# Patient Record
Sex: Female | Born: 1980 | Race: White | Hispanic: No | Marital: Single | Smoking: Current every day smoker
Health system: Southern US, Community
[De-identification: ages and names within clinical notes are randomized; demographics above are authoritative.]

---

## 2017-09-02 ENCOUNTER — Encounter: Payer: Self-pay | Admitting: Emergency Medicine

## 2017-09-02 ENCOUNTER — Other Ambulatory Visit: Payer: Self-pay

## 2017-09-02 ENCOUNTER — Emergency Department
Admission: EM | Admit: 2017-09-02 | Discharge: 2017-09-02 | Disposition: A | Discharge status: Home / Self Care | Payer: BLUE CROSS/BLUE SHIELD | Source: Home / Self Care | Attending: Family Medicine | Admitting: Family Medicine

## 2017-09-02 DIAGNOSIS — R21 Rash and other nonspecific skin eruption: Secondary | ICD-10-CM | POA: Diagnosis not present

## 2017-09-02 MED ORDER — DOXYCYCLINE HYCLATE 100 MG PO CAPS: 100.0000 mg | ORAL_CAPSULE | Freq: Two times a day (BID) | ORAL | 0 refills | Status: AC

## 2017-09-02 MED ORDER — PREDNISONE 20 MG PO TABS: ORAL_TABLET | ORAL | 0 refills | Status: AC

## 2017-09-02 NOTE — ED Triage Notes (Signed)
Rash under Right eye x 2 days, went to a river this weekend, denies itch, hurt or burn

## 2017-09-02 NOTE — ED Provider Notes (Signed)
[facility]    CSN: [id number] Arrival date & time: 09/02/17  1737     History   Chief Complaint Chief Complaint  Patient presents with  . Rash    HPI Kathryn Fischer is a 37 y.o. female.   Patient was swimming in a river 4 days ago.  Two days ago she awoke with a rash beneath her right eye that has persisted.  She feels well otherwise.  The history is provided by the patient.  Rash  Location:  Face Facial rash location:  R cheek Quality: blistering, dryness and redness   Quality: not bruising, not burning, not draining, not itchy, not painful, not peeling, not scaling, not swelling and not weeping   Severity:  Mild Onset quality:  Sudden Duration:  2 days Timing:  Constant Progression:  Unchanged Chronicity:  New Context: not animal contact, not chemical exposure, not exposure to similar rash, not food, not hot tub use, not insect bite/sting, not medications, not new detergent/soap, not nuts, not plant contact and not sick contacts   Context comment:  Swimming in river Relieved by:  Nothing Worsened by:  Nothing Ineffective treatments:  None tried Associated symptoms: no abdominal pain, no diarrhea, no fatigue, no fever, no headaches, no induration, no joint pain, no myalgias, no nausea, no periorbital edema, no sore throat, no throat swelling, no tongue swelling and no URI     History reviewed. No pertinent past medical history.  There are no active problems to display for this patient.   History reviewed. No pertinent surgical history.  OB History   None      Home Medications    Prior to Admission medications   Medication Sig Start Date End Date Taking? Authorizing Provider  cetirizine (ZYRTEC) 10 MG tablet Take 10 mg by mouth daily.   Yes [provider]  phentermine 37.5 MG capsule Take 37.5 mg by mouth every morning.   Yes [provider]  doxycycline (VIBRAMYCIN) 100 MG capsule Take 1 capsule (100 mg total) by mouth 2  (two) times daily. Take with food. 09/02/17   Lattie HawBeese, Stephen A, MD  predniSONE (DELTASONE) 20 MG tablet Take one tab by mouth twice daily for 3 days, then one daily. Take with food. 09/02/17   Lattie HawBeese, Stephen A, MD    Family History History reviewed. No pertinent family history.  Social History Social History   Tobacco Use  . Smoking status: Current Every Day Smoker  . Smokeless tobacco: Never Used  Substance Use Topics  . Alcohol use: Yes  . Drug use: Not Currently     Allergies   Patient has no known allergies.   Review of Systems Review of Systems  Constitutional: Negative for fatigue and fever.  HENT: Negative for sore throat.   Gastrointestinal: Negative for abdominal pain, diarrhea and nausea.  Musculoskeletal: Negative for arthralgias and myalgias.  Skin: Positive for rash.  Neurological: Negative for headaches.  All other systems reviewed and are negative.    Physical Exam Triage Vital Signs ED Triage Vitals  Enc Vitals Group     BP 09/02/17 1807 116/77     Pulse Rate 09/02/17 1807 78     Resp --      Temp 09/02/17 1807 98.2 F (36.8 C)     Temp Source 09/02/17 1807 Oral     SpO2 09/02/17 1807 100 %     Weight 09/02/17 1808 221 lb (100.2 kg)     Height 09/02/17 1808 5\' 7"  (1.702 m)  Head Circumference --      Peak Flow --      Pain Score 09/02/17 1807 0     Pain Loc --      Pain Edu? --      Excl. in GC? --    No data found.  Updated Vital Signs BP 116/77 (BP Location: Right Arm)   Pulse 78   Temp 98.2 F (36.8 C) (Oral)   Ht 5\' 7"  (1.702 m)   Wt 100.2 kg   LMP 08/13/2017 (Exact Date)   SpO2 100%   BMI 34.61 kg/m   Visual Acuity Right Eye Distance:   Left Eye Distance:   Bilateral Distance:    Right Eye Near:   Left Eye Near:    Bilateral Near:     Physical Exam  Constitutional: She appears well-developed and well-nourished. No distress.  HENT:  Head: Normocephalic.  Right Ear: Tympanic membrane, external ear and ear canal  normal.  Left Ear: Tympanic membrane, external ear and ear canal normal.  Nose: Nose normal.  Mouth/Throat: Oropharynx is clear and moist.  Eyes: Pupils are equal, round, and reactive to light.    Beneath the right eye is a patch of macular erythema with tiny central pustules present.  No swelling, warmth, tenderness, or induration.  Neck: Neck supple.  Cardiovascular: Normal rate.  Pulmonary/Chest: Effort normal.  Musculoskeletal: She exhibits no edema.  Lymphadenopathy:    She has no cervical adenopathy.  Neurological: She is alert.  Skin: Skin is warm and dry.  Nursing note and vitals reviewed.    UC Treatments / Results  Labs (all labs ordered are listed, but only abnormal results are displayed) Labs Reviewed - No data to display  EKG None  Radiology No results found.  Procedures Procedures (including critical care time)  Medications Ordered in UC Medications - No data to display  Initial Impression / Assessment and Plan / UC Course  I have reviewed the triage vital signs and the nursing notes.  Pertinent labs & imaging results that were available during my care of the patient were reviewed by me and considered in my medical decision making (see chart for details).    ?Contact dermatitis vs ?folliculitis. Begin empiric doxycycline for staph coverage, and prednisone burst/taper. Followup with Family Doctor if not improved in one week.  Final Clinical Impressions(s) / UC Diagnoses   Final diagnoses:  Rash and nonspecific skin eruption     Discharge Instructions     Return for worsening symptoms.    ED Prescriptions    Medication Sig Dispense Auth. Provider   predniSONE (DELTASONE) 20 MG tablet Take one tab by mouth twice daily for 3 days, then one daily. Take with food. 9 tablet Lattie Haw, MD   doxycycline (VIBRAMYCIN) 100 MG capsule Take 1 capsule (100 mg total) by mouth 2 (two) times daily. Take with food. 14 capsule Lattie Haw, MD         Lattie Haw, MD 09/04/17 [phone]

## 2017-09-02 NOTE — Discharge Instructions (Signed)
Return for worsening symptoms.    

## 2017-09-21 ENCOUNTER — Ambulatory Visit (HOSPITAL_BASED_OUTPATIENT_CLINIC_OR_DEPARTMENT_OTHER)
Admission: RE | Admit: 2017-09-21 | Discharge: 2017-09-21 | Disposition: A | Discharge status: Home / Self Care | Payer: BLUE CROSS/BLUE SHIELD | Source: Ambulatory Visit | Attending: Emergency Medicine | Admitting: Emergency Medicine

## 2017-09-21 ENCOUNTER — Encounter: Payer: Self-pay | Admitting: Emergency Medicine

## 2017-09-21 ENCOUNTER — Emergency Department
Admission: EM | Admit: 2017-09-21 | Discharge: 2017-09-21 | Disposition: A | Discharge status: Home / Self Care | Payer: BLUE CROSS/BLUE SHIELD | Source: Home / Self Care | Attending: Emergency Medicine | Admitting: Emergency Medicine

## 2017-09-21 DIAGNOSIS — R3129 Other microscopic hematuria: Secondary | ICD-10-CM | POA: Diagnosis not present

## 2017-09-21 DIAGNOSIS — N888 Other specified noninflammatory disorders of cervix uteri: Secondary | ICD-10-CM | POA: Insufficient documentation

## 2017-09-21 DIAGNOSIS — R109 Unspecified abdominal pain: Secondary | ICD-10-CM

## 2017-09-21 DIAGNOSIS — R102 Pelvic and perineal pain: Secondary | ICD-10-CM | POA: Diagnosis not present

## 2017-09-21 DIAGNOSIS — N838 Other noninflammatory disorders of ovary, fallopian tube and broad ligament: Secondary | ICD-10-CM | POA: Insufficient documentation

## 2017-09-21 DIAGNOSIS — N839 Noninflammatory disorder of ovary, fallopian tube and broad ligament, unspecified: Secondary | ICD-10-CM

## 2017-09-21 LAB — POCT URINALYSIS DIP (MANUAL ENTRY)
Bilirubin, UA: NEGATIVE
Glucose, UA: NEGATIVE mg/dL
LEUKOCYTES UA: NEGATIVE
Nitrite, UA: NEGATIVE
PROTEIN UA: NEGATIVE mg/dL
Spec Grav, UA: 1.025 (ref 1.010–1.025)
Urobilinogen, UA: 0.2 E.U./dL
pH, UA: 6 (ref 5.0–8.0)

## 2017-09-21 LAB — POCT CBC W AUTO DIFF (K'VILLE URGENT CARE)

## 2017-09-21 LAB — POCT URINE PREGNANCY: PREG TEST UR: NEGATIVE

## 2017-09-21 NOTE — ED Provider Notes (Signed)
[facility]    CSN: [id number] Arrival date & time: 09/21/17  1100     History   Chief Complaint Chief Complaint  Patient presents with  . Abdominal Pain    HPI Kathryn Fischer is a 37 y.o. female.   HPI Patient was being sexuallyactiveshe developed the pain in her lower abdomen Pain is in thesuprapubic area. Her last menstrual period ended September 6.. She has had a tubal ligation andC-section. She denies urinary symptoms. She has no discharge or bleeding. She has used 2 colonic suppositories for constipation but has really not been constipated. History reviewed. No pertinent past medical history.  There are no active problems to display for this patient.   Past Surgical History:  Procedure Laterality Date  . CESAREAN SECTION  05/29/2005 09/13/2006    OB History   None      Home Medications    Prior to Admission medications   Medication Sig Start Date End Date Taking? Authorizing Provider  cetirizine (ZYRTEC) 10 MG tablet Take 10 mg by mouth daily.    [provider]  doxycycline (VIBRAMYCIN) 100 MG capsule Take 1 capsule (100 mg total) by mouth 2 (two) times daily. Take with food. 09/02/17   Lattie Haw, MD  phentermine 37.5 MG capsule Take 37.5 mg by mouth every morning.    [provider]  predniSONE (DELTASONE) 20 MG tablet Take one tab by mouth twice daily for 3 days, then one daily. Take with food. 09/02/17   Lattie Haw, MD    Family History No family history on file.  Social History Social History   Tobacco Use  . Smoking status: Current Every Day Smoker  . Smokeless tobacco: Never Used  Substance Use Topics  . Alcohol use: Yes  . Drug use: Not Currently     Allergies   Phenergan [promethazine hcl]   Review of Systems Review of Systems  Constitutional: Negative.   HENT: Negative.   Respiratory: Negative.   Cardiovascular: Negative.   Gastrointestinal: Positive for abdominal pain. Negative for blood  in stool and nausea.  Genitourinary: Negative.      Physical Exam Triage Vital Signs ED Triage Vitals  Enc Vitals Group     BP 09/21/17 1128 112/76     Pulse Rate 09/21/17 1128 70     Resp --      Temp 09/21/17 1128 98.5 F (36.9 C)     Temp Source 09/21/17 1128 Oral     SpO2 09/21/17 1128 100 %     Weight 09/21/17 1129 218 lb 8 oz (99.1 kg)     Height 09/21/17 1129 5\' 8"  (1.727 m)     Head Circumference --      Peak Flow --      Pain Score 09/21/17 1129 3     Pain Loc --      Pain Edu? --      Excl. in GC? --    No data found.  Updated Vital Signs BP 112/76 (BP Location: Right Arm)   Pulse 70   Temp 98.5 F (36.9 C) (Oral)   Ht 5\' 8"  (1.727 m)   Wt 99.1 kg   LMP 09/13/2017   SpO2 100%   BMI 33.22 kg/m   Visual Acuity Right Eye Distance:   Left Eye Distance:   Bilateral Distance:    Right Eye Near:   Left Eye Near:    Bilateral Near:     Physical Exam  Constitutional: She appears well-developed  and well-nourished.  HENT:  Head: Normocephalic.  Eyes: Pupils are equal, round, and reactive to light.  Cardiovascular: Normal rate, regular rhythm, normal heart sounds and intact distal pulses.  Pulmonary/Chest: Effort normal and breath sounds normal.  Abdominal: Soft. Normal appearance and bowel sounds are normal.  There is tenderness in the suprapubic area. There is minimal rebound noted.  Genitourinary:  Genitourinary Comments: On pelvic exam there is no tenderness on digital exam right adnexa. There is no tenderness over the uterus. There is minimal cervical motion tenderness and minimal left adnexal tenderness without a mass.     UC Treatments / Results  Labs (all labs ordered are listed, but only abnormal results are displayed) Labs Reviewed  POCT URINALYSIS DIP (MANUAL ENTRY) - Abnormal; Notable for the following components:      Result Value   Ketones, POC UA trace (5) (*)    Blood, UA moderate (*)    All other components within normal limits    URINE CULTURE  POCT CBC W AUTO DIFF ([facility])  POCT URINE PREGNANCY  white blood count 6000. Hemoglobin 12.1. Platelet count 273,000.  EKG None  Radiology [place] Pelvis Transvanginal Non-ob (tv Only)  Result Date: 09/21/2017 CLINICAL DATA:  Mid suprapubic pain 4-5 days. EXAM: ULTRASOUND PELVIS TRANSVAGINAL TECHNIQUE: Transvaginal ultrasound examination of the pelvis was performed including evaluation of the uterus, ovaries, adnexal regions, and pelvic cul-de-sac. COMPARISON:  None. FINDINGS: Uterus Measurements: 5.1 x 5.9 x 11.0 cm. No fibroids or other mass visualized. Oval 9 mm complicated cystic structure over the cervix likely complicated nabothian cyst. Endometrium Thickness: 11 mm. No focal abnormality visualized. Small amount of simple fluid over the endocervical canal. Right ovary Measurements: 2.7 x 2.4 x 4.2 cm. Normal appearance/no adnexal mass. 2.9 cm oval isoechoic structure with no internal vascularity and increased through transmission. This may represent an endometrioma. Left ovary Measurements: 1.9 x 2.3 x 2.7 cm. Normal appearance/no adnexal mass. Other findings:  No abnormal free fluid IMPRESSION: Normal size uterus without focal mass. Small amount of simple fluid over the endocervical canal and complicated nabothian cyst. Ovaries are normal size. 2.9 cm oval isoechoic right ovarian mass which may represent an endometrioma. Recommend follow-up pelvic ultrasound 6 weeks. Electronically Signed   By: Elberta Fortisaniel  Boyle M.D.   On: 09/21/2017 15:06    Procedures Procedures (including critical care time)  Medications Ordered in UC Medications - No data to display  Initial Impression / Assessment and Plan / UC Course  I have reviewed the triage vital signs and the nursing notes.  Pertinent labs & imaging results that were available during my care of the patient were reviewed by me and considered in my medical decision making (see chart for details). atient has low abdominal  pain following being sexually active.She did have moderate blood inr urinalysis but a negative pregnancy test with normal white count hemoglobin 12.1.on pelvic exam there was mild tenderness in the left adnexa. We'll start with pelvic ultrasound and if this is negative we'll discuss having a stone protocol CT abdomen. Ultrasound returned showing what appears to be a 2.9 cm oval structure within the right ovary. This was suspicious for an endometrioma and follow-up was recommended. I discussed this with the patient. She did not want to have her CT done this afternoon but I did place an order for CT abdomen pelvis without IV contrast to be done. She will call in the morning with a status update. In the interim she will take Aleve 2  tablets twice a day.she will call in the morning with a status update.     Final Clinical Impressions(s) / UC Diagnoses   Final diagnoses:  Abdominal pain in female  Other microscopic hematuria  Ovarian mass, right     Discharge Instructions     Please proceed to the hospital for your ultrasound.    ED Prescriptions    None     Controlled Substance Prescriptions [place] Controlled Substance Registry consulted? Not Applicable   Collene Gobble, MD 09/21/17 [phone]

## 2017-09-21 NOTE — Discharge Instructions (Signed)
Please proceed to the hospital for your ultrasound.

## 2017-09-21 NOTE — ED Triage Notes (Signed)
Patient c/o lower abdominal pain since Tuesday night after having sex.  Patient is having a lot of pain and pressure, not constipated, having regular BM's, no nausea or vomiting, taking Ibuprofen.

## 2017-09-22 ENCOUNTER — Telehealth: Payer: Self-pay

## 2017-09-22 ENCOUNTER — Telehealth: Payer: Self-pay | Admitting: Emergency Medicine

## 2017-09-22 NOTE — Telephone Encounter (Signed)
Pt followed with call saying she's feeling much better. Per Dr Cleta Albertsaub, hold off on CT until pt follows up with OBGYN for further eval.

## 2017-09-22 NOTE — Telephone Encounter (Signed)
Left VM asking patient to call us with her status report.

## 2017-09-23 LAB — URINE CULTURE
MICRO NUMBER:: 91105564
SPECIMEN QUALITY:: ADEQUATE

## 2020-05-07 IMAGING — US US TRANSVAGINAL NON-OB
1 series · 13 of 25 positions shown · non-contrast
Comparison: None.

CLINICAL DATA: Mid suprapubic pain 4-5 days.

EXAM:
ULTRASOUND PELVIS TRANSVAGINAL
TECHNIQUE: Transvaginal ultrasound examination of the pelvis was performed
including evaluation of the uterus, ovaries, adnexal regions, and
pelvic cul-de-sac.

[Series 1: us transvaginal non-ob · 0.14mm/px · 13 of 45 slices shown]
[im 1/45]
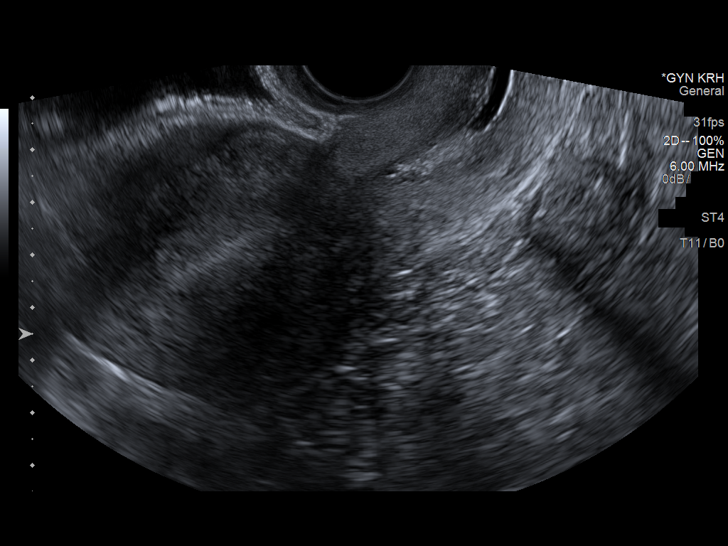
[im 4/45]
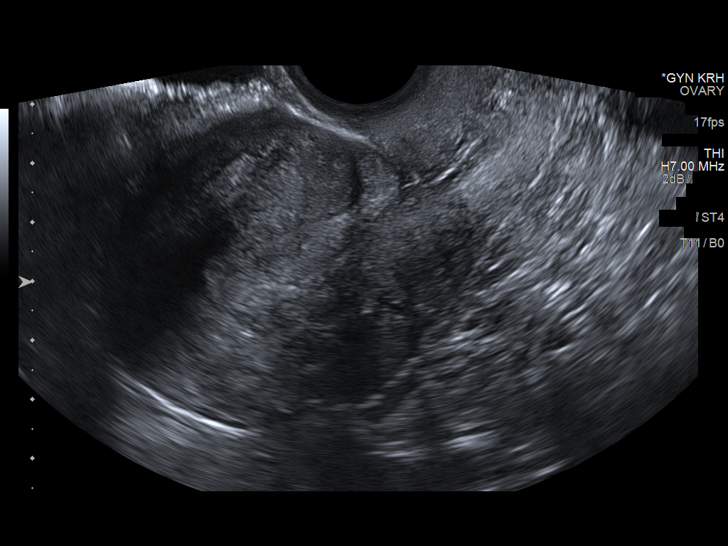
[im 8/45]
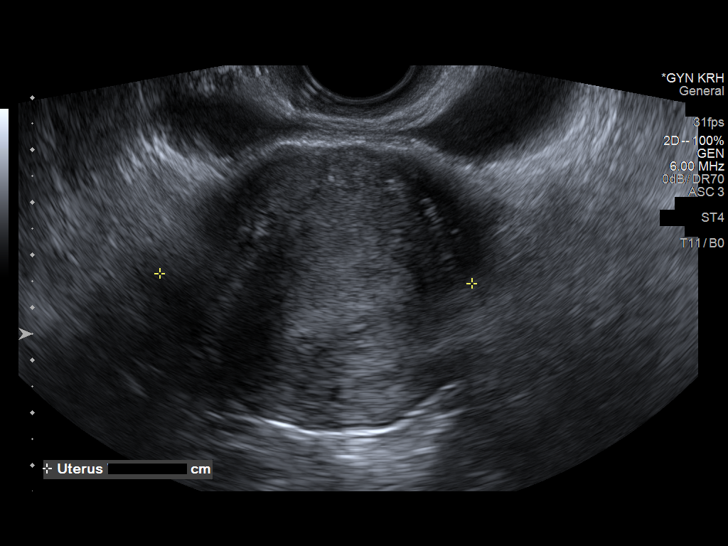
[im 12/45]
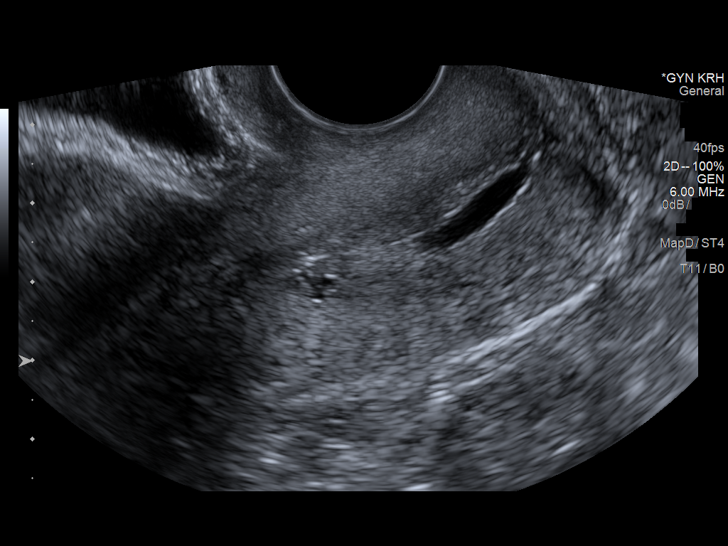
[im 15/45]
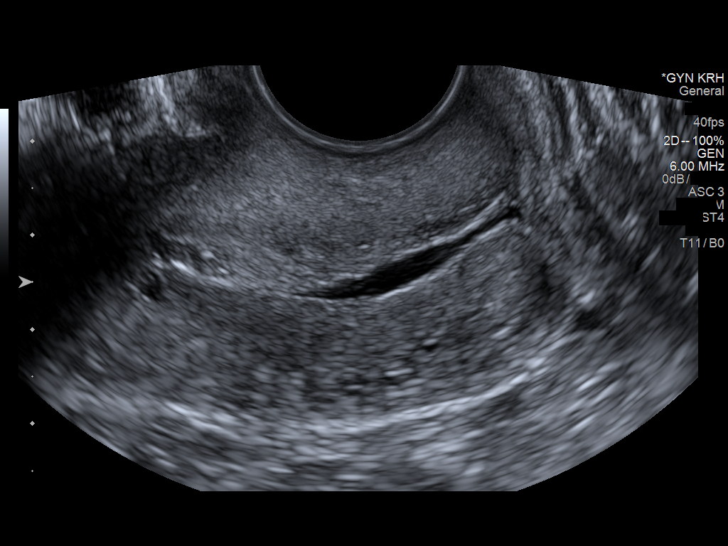
[im 19/45]
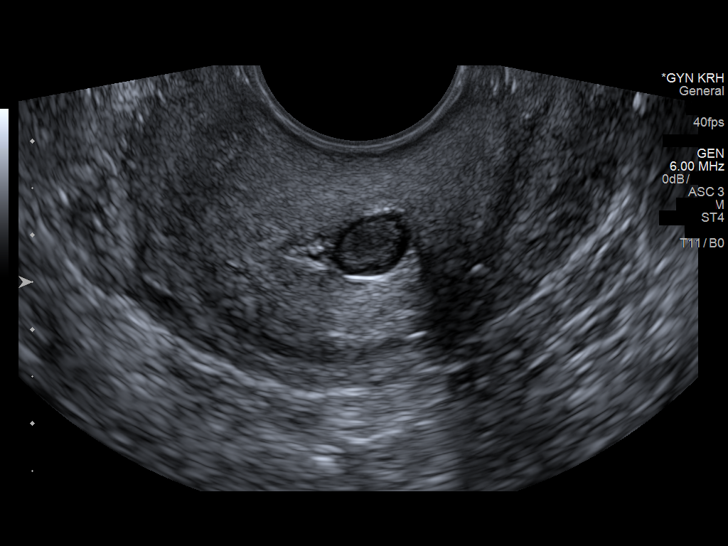
[im 23/45]
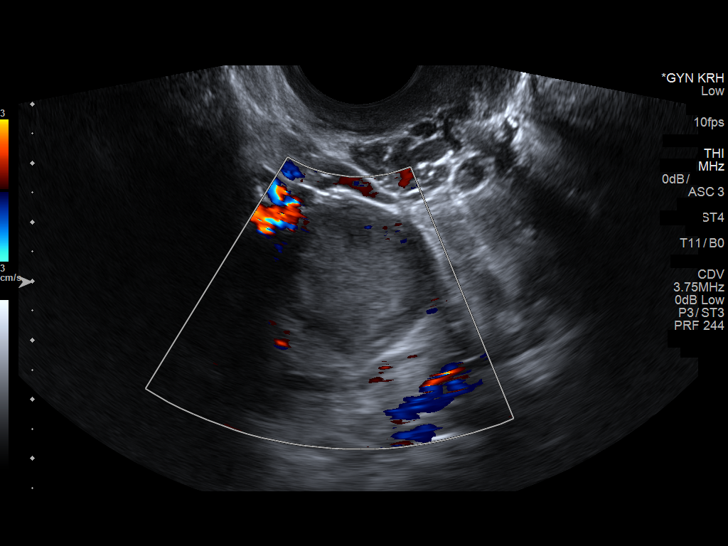
[im 26/45]
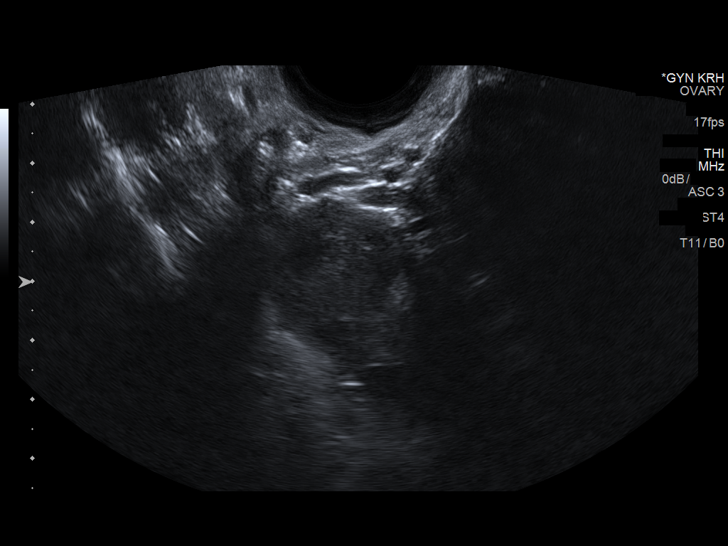
[im 30/45]
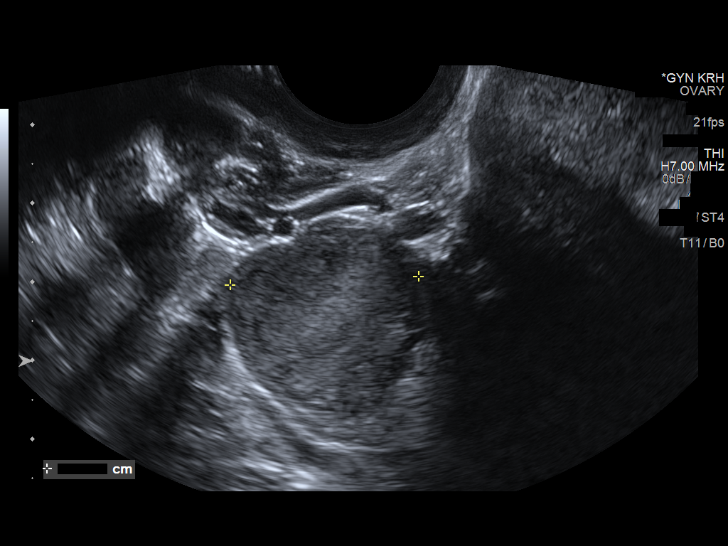
[im 34/45]
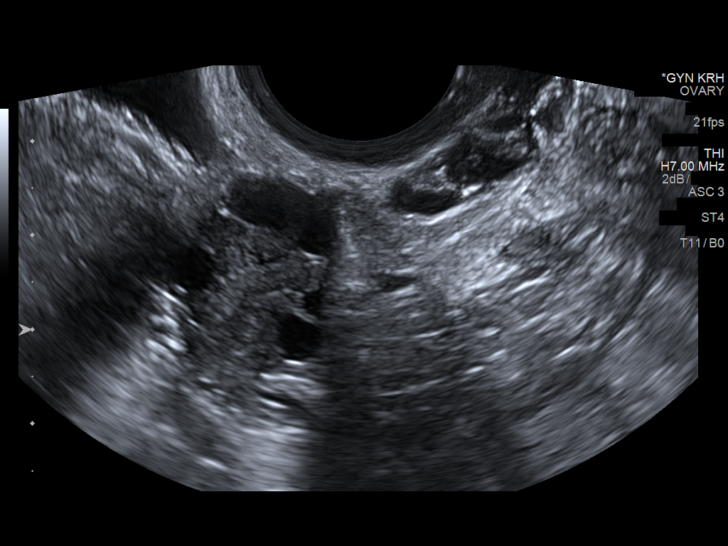
[im 37/45]
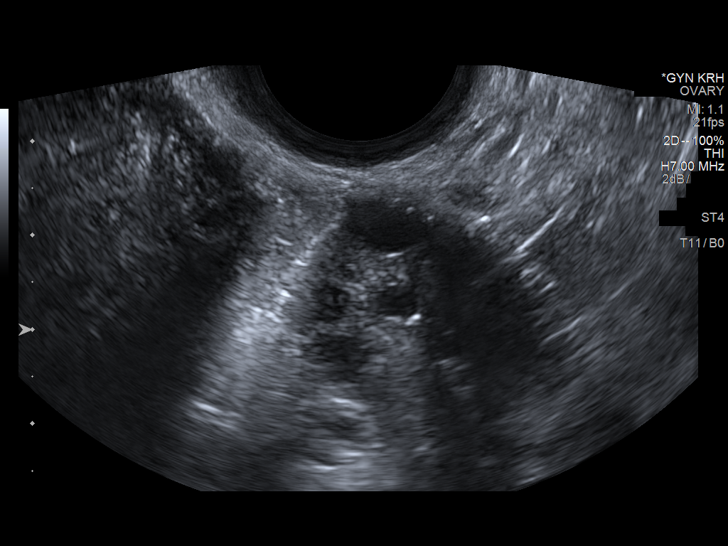
[im 41/45]
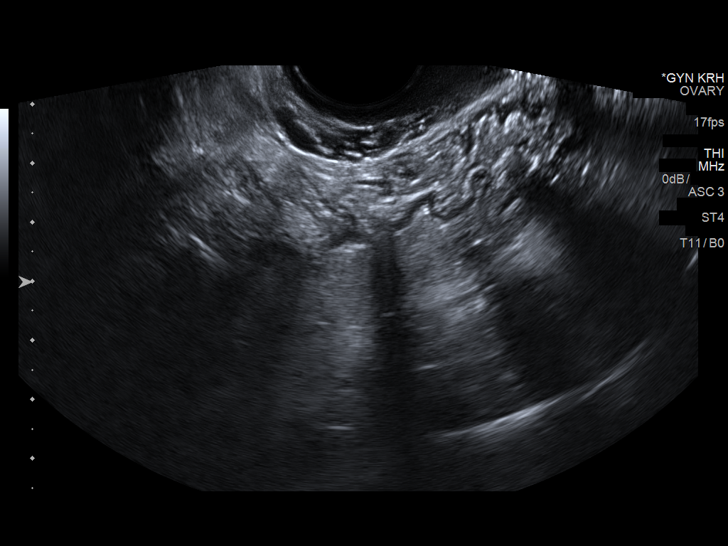
[im 45/45]
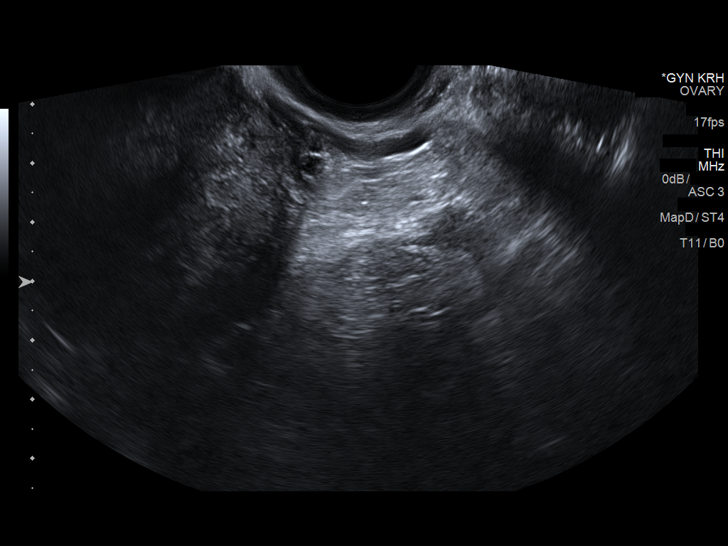

[13 of 25 positions shown; findings below may reference images not displayed]

FINDINGS: Uterus

Measurements: 5.1 x 5.9 x 11.0 cm. No fibroids or other mass
visualized. Oval 9 mm complicated cystic structure over the cervix
likely complicated nabothian cyst.

Endometrium

Thickness: 11 mm. No focal abnormality visualized. Small amount of
simple fluid over the endocervical canal.

Right ovary

Measurements: 2.7 x 2.4 x 4.2 cm. Normal appearance/no adnexal mass.
2.9 cm oval isoechoic structure with no internal vascularity and
increased through transmission. This may represent an endometrioma.

Left ovary

Measurements: 1.9 x 2.3 x 2.7 cm. Normal appearance/no adnexal mass.

Other findings:  No abnormal free fluid
IMPRESSION: Normal size uterus without focal mass. Small amount of simple fluid
over the endocervical canal and complicated nabothian cyst.

Ovaries are normal size. 2.9 cm oval isoechoic right ovarian mass
which may represent an endometrioma. Recommend follow-up pelvic
ultrasound 6 weeks.

## 2022-04-09 DIAGNOSIS — Z419 Encounter for procedure for purposes other than remedying health state, unspecified: Secondary | ICD-10-CM | POA: Diagnosis not present

## 2022-05-09 DIAGNOSIS — Z419 Encounter for procedure for purposes other than remedying health state, unspecified: Secondary | ICD-10-CM | POA: Diagnosis not present

## 2022-06-09 DIAGNOSIS — Z419 Encounter for procedure for purposes other than remedying health state, unspecified: Secondary | ICD-10-CM | POA: Diagnosis not present

## 2022-07-09 DIAGNOSIS — Z419 Encounter for procedure for purposes other than remedying health state, unspecified: Secondary | ICD-10-CM | POA: Diagnosis not present

## 2022-08-09 DIAGNOSIS — Z419 Encounter for procedure for purposes other than remedying health state, unspecified: Secondary | ICD-10-CM | POA: Diagnosis not present

## 2022-09-09 DIAGNOSIS — Z419 Encounter for procedure for purposes other than remedying health state, unspecified: Secondary | ICD-10-CM | POA: Diagnosis not present

## 2022-10-09 DIAGNOSIS — Z419 Encounter for procedure for purposes other than remedying health state, unspecified: Secondary | ICD-10-CM | POA: Diagnosis not present

## 2022-10-22 DIAGNOSIS — Z6834 Body mass index (BMI) 34.0-34.9, adult: Secondary | ICD-10-CM | POA: Diagnosis not present

## 2022-10-22 DIAGNOSIS — F411 Generalized anxiety disorder: Secondary | ICD-10-CM | POA: Diagnosis not present

## 2022-10-22 DIAGNOSIS — E66811 Obesity, class 1: Secondary | ICD-10-CM | POA: Diagnosis not present

## 2022-11-09 DIAGNOSIS — Z419 Encounter for procedure for purposes other than remedying health state, unspecified: Secondary | ICD-10-CM | POA: Diagnosis not present

## 2022-11-19 DIAGNOSIS — F411 Generalized anxiety disorder: Secondary | ICD-10-CM | POA: Diagnosis not present

## 2022-11-19 DIAGNOSIS — E6609 Other obesity due to excess calories: Secondary | ICD-10-CM | POA: Diagnosis not present

## 2022-11-19 DIAGNOSIS — Z6833 Body mass index (BMI) 33.0-33.9, adult: Secondary | ICD-10-CM | POA: Diagnosis not present

## 2022-11-19 DIAGNOSIS — E66811 Obesity, class 1: Secondary | ICD-10-CM | POA: Diagnosis not present

## 2022-12-09 DIAGNOSIS — Z419 Encounter for procedure for purposes other than remedying health state, unspecified: Secondary | ICD-10-CM | POA: Diagnosis not present

## 2022-12-10 DIAGNOSIS — Z6833 Body mass index (BMI) 33.0-33.9, adult: Secondary | ICD-10-CM | POA: Diagnosis not present

## 2022-12-10 DIAGNOSIS — F988 Other specified behavioral and emotional disorders with onset usually occurring in childhood and adolescence: Secondary | ICD-10-CM | POA: Diagnosis not present

## 2022-12-10 DIAGNOSIS — E66811 Obesity, class 1: Secondary | ICD-10-CM | POA: Diagnosis not present

## 2022-12-18 DIAGNOSIS — F988 Other specified behavioral and emotional disorders with onset usually occurring in childhood and adolescence: Secondary | ICD-10-CM | POA: Diagnosis not present

## 2022-12-18 DIAGNOSIS — F411 Generalized anxiety disorder: Secondary | ICD-10-CM | POA: Diagnosis not present

## 2023-01-09 DIAGNOSIS — Z419 Encounter for procedure for purposes other than remedying health state, unspecified: Secondary | ICD-10-CM | POA: Diagnosis not present

## 2023-01-15 DIAGNOSIS — E66811 Obesity, class 1: Secondary | ICD-10-CM | POA: Diagnosis not present

## 2023-01-15 DIAGNOSIS — Z6832 Body mass index (BMI) 32.0-32.9, adult: Secondary | ICD-10-CM | POA: Diagnosis not present

## 2023-01-15 DIAGNOSIS — R630 Anorexia: Secondary | ICD-10-CM | POA: Diagnosis not present

## 2023-01-15 DIAGNOSIS — R110 Nausea: Secondary | ICD-10-CM | POA: Diagnosis not present

## 2023-02-09 DIAGNOSIS — Z419 Encounter for procedure for purposes other than remedying health state, unspecified: Secondary | ICD-10-CM | POA: Diagnosis not present

## 2023-02-11 DIAGNOSIS — Z6832 Body mass index (BMI) 32.0-32.9, adult: Secondary | ICD-10-CM | POA: Diagnosis not present

## 2023-02-11 DIAGNOSIS — E66811 Obesity, class 1: Secondary | ICD-10-CM | POA: Diagnosis not present

## 2023-02-11 DIAGNOSIS — R198 Other specified symptoms and signs involving the digestive system and abdomen: Secondary | ICD-10-CM | POA: Diagnosis not present

## 2023-02-26 DIAGNOSIS — R55 Syncope and collapse: Secondary | ICD-10-CM | POA: Diagnosis not present

## 2023-02-28 DIAGNOSIS — R55 Syncope and collapse: Secondary | ICD-10-CM | POA: Diagnosis not present

## 2023-03-09 DIAGNOSIS — Z419 Encounter for procedure for purposes other than remedying health state, unspecified: Secondary | ICD-10-CM | POA: Diagnosis not present

## 2023-03-11 DIAGNOSIS — K30 Functional dyspepsia: Secondary | ICD-10-CM | POA: Diagnosis not present

## 2023-03-11 DIAGNOSIS — E66812 Obesity, class 2: Secondary | ICD-10-CM | POA: Diagnosis not present

## 2023-03-11 DIAGNOSIS — Z6832 Body mass index (BMI) 32.0-32.9, adult: Secondary | ICD-10-CM | POA: Diagnosis not present

## 2023-03-25 DIAGNOSIS — Z87898 Personal history of other specified conditions: Secondary | ICD-10-CM | POA: Diagnosis not present

## 2023-04-08 DIAGNOSIS — Z6831 Body mass index (BMI) 31.0-31.9, adult: Secondary | ICD-10-CM | POA: Diagnosis not present

## 2023-04-08 DIAGNOSIS — E66811 Obesity, class 1: Secondary | ICD-10-CM | POA: Diagnosis not present

## 2023-04-08 DIAGNOSIS — R632 Polyphagia: Secondary | ICD-10-CM | POA: Diagnosis not present

## 2023-04-20 DIAGNOSIS — Z419 Encounter for procedure for purposes other than remedying health state, unspecified: Secondary | ICD-10-CM | POA: Diagnosis not present

## 2023-05-13 DIAGNOSIS — Z6831 Body mass index (BMI) 31.0-31.9, adult: Secondary | ICD-10-CM | POA: Diagnosis not present

## 2023-05-13 DIAGNOSIS — F4321 Adjustment disorder with depressed mood: Secondary | ICD-10-CM | POA: Diagnosis not present

## 2023-05-13 DIAGNOSIS — E66811 Obesity, class 1: Secondary | ICD-10-CM | POA: Diagnosis not present

## 2023-05-20 DIAGNOSIS — Z419 Encounter for procedure for purposes other than remedying health state, unspecified: Secondary | ICD-10-CM | POA: Diagnosis not present

## 2023-06-06 DIAGNOSIS — B351 Tinea unguium: Secondary | ICD-10-CM | POA: Diagnosis not present

## 2023-06-12 DIAGNOSIS — R198 Other specified symptoms and signs involving the digestive system and abdomen: Secondary | ICD-10-CM | POA: Diagnosis not present

## 2023-06-12 DIAGNOSIS — Z6830 Body mass index (BMI) 30.0-30.9, adult: Secondary | ICD-10-CM | POA: Diagnosis not present

## 2023-06-12 DIAGNOSIS — E66811 Obesity, class 1: Secondary | ICD-10-CM | POA: Diagnosis not present

## 2023-06-18 DIAGNOSIS — S30860A Insect bite (nonvenomous) of lower back and pelvis, initial encounter: Secondary | ICD-10-CM | POA: Diagnosis not present

## 2023-06-20 DIAGNOSIS — Z419 Encounter for procedure for purposes other than remedying health state, unspecified: Secondary | ICD-10-CM | POA: Diagnosis not present

## 2023-07-15 DIAGNOSIS — Z6830 Body mass index (BMI) 30.0-30.9, adult: Secondary | ICD-10-CM | POA: Diagnosis not present

## 2023-07-15 DIAGNOSIS — E66811 Obesity, class 1: Secondary | ICD-10-CM | POA: Diagnosis not present

## 2023-07-20 DIAGNOSIS — Z419 Encounter for procedure for purposes other than remedying health state, unspecified: Secondary | ICD-10-CM | POA: Diagnosis not present

## 2023-08-12 DIAGNOSIS — R630 Anorexia: Secondary | ICD-10-CM | POA: Diagnosis not present

## 2023-08-12 DIAGNOSIS — Z6829 Body mass index (BMI) 29.0-29.9, adult: Secondary | ICD-10-CM | POA: Diagnosis not present

## 2023-08-12 DIAGNOSIS — E663 Overweight: Secondary | ICD-10-CM | POA: Diagnosis not present

## 2023-08-20 DIAGNOSIS — Z419 Encounter for procedure for purposes other than remedying health state, unspecified: Secondary | ICD-10-CM | POA: Diagnosis not present

## 2023-08-21 DIAGNOSIS — R300 Dysuria: Secondary | ICD-10-CM | POA: Diagnosis not present

## 2023-09-10 DIAGNOSIS — E663 Overweight: Secondary | ICD-10-CM | POA: Diagnosis not present

## 2023-09-10 DIAGNOSIS — Z6829 Body mass index (BMI) 29.0-29.9, adult: Secondary | ICD-10-CM | POA: Diagnosis not present

## 2023-09-13 DIAGNOSIS — J029 Acute pharyngitis, unspecified: Secondary | ICD-10-CM | POA: Diagnosis not present

## 2023-09-20 DIAGNOSIS — Z419 Encounter for procedure for purposes other than remedying health state, unspecified: Secondary | ICD-10-CM | POA: Diagnosis not present

## 2023-09-20 DIAGNOSIS — F988 Other specified behavioral and emotional disorders with onset usually occurring in childhood and adolescence: Secondary | ICD-10-CM | POA: Diagnosis not present

## 2023-10-07 DIAGNOSIS — Z6829 Body mass index (BMI) 29.0-29.9, adult: Secondary | ICD-10-CM | POA: Diagnosis not present

## 2023-10-07 DIAGNOSIS — E663 Overweight: Secondary | ICD-10-CM | POA: Diagnosis not present

## 2023-10-07 DIAGNOSIS — R632 Polyphagia: Secondary | ICD-10-CM | POA: Diagnosis not present

## 2023-10-11 DIAGNOSIS — J45909 Unspecified asthma, uncomplicated: Secondary | ICD-10-CM | POA: Diagnosis not present

## 2023-10-11 DIAGNOSIS — R55 Syncope and collapse: Secondary | ICD-10-CM | POA: Diagnosis not present

## 2023-10-11 DIAGNOSIS — R202 Paresthesia of skin: Secondary | ICD-10-CM | POA: Diagnosis not present

## 2023-10-15 DIAGNOSIS — Z0000 Encounter for general adult medical examination without abnormal findings: Secondary | ICD-10-CM | POA: Diagnosis not present

## 2023-10-15 DIAGNOSIS — Z1331 Encounter for screening for depression: Secondary | ICD-10-CM | POA: Diagnosis not present

## 2023-10-31 DIAGNOSIS — M898X7 Other specified disorders of bone, ankle and foot: Secondary | ICD-10-CM | POA: Diagnosis not present

## 2023-10-31 DIAGNOSIS — M7731 Calcaneal spur, right foot: Secondary | ICD-10-CM | POA: Diagnosis not present

## 2023-10-31 DIAGNOSIS — M24874 Other specific joint derangements of right foot, not elsewhere classified: Secondary | ICD-10-CM | POA: Diagnosis not present

## 2023-10-31 DIAGNOSIS — M21611 Bunion of right foot: Secondary | ICD-10-CM | POA: Diagnosis not present

## 2023-10-31 DIAGNOSIS — M19071 Primary osteoarthritis, right ankle and foot: Secondary | ICD-10-CM | POA: Diagnosis not present

## 2023-10-31 DIAGNOSIS — M2011 Hallux valgus (acquired), right foot: Secondary | ICD-10-CM | POA: Diagnosis not present

## 2023-10-31 DIAGNOSIS — Q66221 Congenital metatarsus adductus, right foot: Secondary | ICD-10-CM | POA: Diagnosis not present

## 2023-11-04 DIAGNOSIS — Z6829 Body mass index (BMI) 29.0-29.9, adult: Secondary | ICD-10-CM | POA: Diagnosis not present

## 2023-11-04 DIAGNOSIS — E663 Overweight: Secondary | ICD-10-CM | POA: Diagnosis not present

## 2023-11-04 DIAGNOSIS — R632 Polyphagia: Secondary | ICD-10-CM | POA: Diagnosis not present

## 2023-12-20 DIAGNOSIS — M2011 Hallux valgus (acquired), right foot: Secondary | ICD-10-CM | POA: Diagnosis not present

## 2023-12-20 DIAGNOSIS — Q66221 Congenital metatarsus adductus, right foot: Secondary | ICD-10-CM | POA: Diagnosis not present

## 2023-12-25 DIAGNOSIS — Z0000 Encounter for general adult medical examination without abnormal findings: Secondary | ICD-10-CM | POA: Diagnosis not present

## 2023-12-26 DIAGNOSIS — R55 Syncope and collapse: Secondary | ICD-10-CM | POA: Diagnosis not present

## 2023-12-26 DIAGNOSIS — R202 Paresthesia of skin: Secondary | ICD-10-CM | POA: Diagnosis not present

## 2023-12-30 DIAGNOSIS — R202 Paresthesia of skin: Secondary | ICD-10-CM | POA: Diagnosis not present

## 2023-12-30 DIAGNOSIS — R55 Syncope and collapse: Secondary | ICD-10-CM | POA: Diagnosis not present

## 2023-12-30 DIAGNOSIS — M2011 Hallux valgus (acquired), right foot: Secondary | ICD-10-CM | POA: Diagnosis not present

## 2024-01-07 DIAGNOSIS — Z13220 Encounter for screening for lipoid disorders: Secondary | ICD-10-CM | POA: Diagnosis not present

## 2024-01-07 DIAGNOSIS — Q66221 Congenital metatarsus adductus, right foot: Secondary | ICD-10-CM | POA: Diagnosis not present

## 2024-01-07 DIAGNOSIS — M2011 Hallux valgus (acquired), right foot: Secondary | ICD-10-CM | POA: Diagnosis not present

## 2024-01-07 DIAGNOSIS — Z1329 Encounter for screening for other suspected endocrine disorder: Secondary | ICD-10-CM | POA: Diagnosis not present

## 2024-01-07 DIAGNOSIS — Z131 Encounter for screening for diabetes mellitus: Secondary | ICD-10-CM | POA: Diagnosis not present

## 2024-01-07 DIAGNOSIS — Z0000 Encounter for general adult medical examination without abnormal findings: Secondary | ICD-10-CM | POA: Diagnosis not present
